# Patient Record
Sex: Female | Born: 1970 | Race: White | Hispanic: No | Marital: Married | Smoking: Current every day smoker
Health system: Southern US, Community
[De-identification: ages and names within clinical notes are randomized; demographics above are authoritative.]

## PROBLEM LIST (undated history)

## (undated) DIAGNOSIS — I10 Essential (primary) hypertension: Secondary | ICD-10-CM

## (undated) DIAGNOSIS — E162 Hypoglycemia, unspecified: Secondary | ICD-10-CM

## (undated) HISTORY — DX: Hypoglycemia, unspecified: E16.2

## (undated) HISTORY — DX: Essential (primary) hypertension: I10

---

## 2004-11-01 ENCOUNTER — Ambulatory Visit: Payer: Self-pay | Admitting: Family Medicine

## 2006-02-25 ENCOUNTER — Ambulatory Visit: Payer: Self-pay | Admitting: Gastroenterology

## 2006-03-07 ENCOUNTER — Ambulatory Visit: Payer: Self-pay | Admitting: Gastroenterology

## 2006-03-17 ENCOUNTER — Ambulatory Visit: Payer: Self-pay | Admitting: Gastroenterology

## 2006-09-23 ENCOUNTER — Ambulatory Visit: Payer: Self-pay | Admitting: Internal Medicine

## 2007-07-16 ENCOUNTER — Ambulatory Visit: Payer: Self-pay | Admitting: Internal Medicine

## 2007-10-10 ENCOUNTER — Ambulatory Visit: Payer: Self-pay | Admitting: Family Medicine

## 2008-01-25 ENCOUNTER — Ambulatory Visit: Payer: Self-pay | Admitting: Internal Medicine

## 2008-10-24 ENCOUNTER — Emergency Department: Payer: Self-pay | Admitting: Emergency Medicine

## 2010-10-29 ENCOUNTER — Ambulatory Visit: Payer: Self-pay

## 2011-01-29 ENCOUNTER — Observation Stay: Payer: Self-pay | Admitting: Internal Medicine

## 2011-01-29 ENCOUNTER — Ambulatory Visit: Payer: Self-pay

## 2011-01-29 LAB — COMPREHENSIVE METABOLIC PANEL
Albumin: 3.7 g/dL (ref 3.4–5.0)
Anion Gap: 9 (ref 7–16)
Bilirubin,Total: 0.3 mg/dL (ref 0.2–1.0)
Calcium, Total: 8.5 mg/dL (ref 8.5–10.1)
Co2: 26 mmol/L (ref 21–32)
Creatinine: 0.77 mg/dL (ref 0.60–1.30)
EGFR (African American): >60
EGFR (Non-African Amer.): >60
Glucose: 86 mg/dL (ref 65–99)
Osmolality: 285 (ref 275–301)
Potassium: 4.4 mmol/L (ref 3.5–5.1)
SGOT(AST): 21 U/L (ref 15–37)
Sodium: 143 mmol/L (ref 136–145)

## 2011-01-29 LAB — CBC
HGB: 13.9 g/dL (ref 12.0–16.0)
MCV: 90 fL (ref 80–100)
RBC: 4.56 10*6/uL (ref 3.80–5.20)
RDW: 13.9 % (ref 11.5–14.5)

## 2011-01-29 LAB — URINALYSIS, COMPLETE
Bilirubin,UR: NEGATIVE
Glucose,UR: NEGATIVE mg/dL (ref 0–75)
Leukocyte Esterase: NEGATIVE
Nitrite: NEGATIVE
Ph: 8 (ref 4.5–8.0)
WBC UR: <1 /HPF (ref 0–5)

## 2011-01-29 LAB — TROPONIN I
Troponin-I: <0.02 ng/mL
Troponin-I: <0.02 ng/mL

## 2011-01-29 LAB — CK TOTAL AND CKMB (NOT AT ARMC): CK, Total: 109 U/L (ref 21–215)

## 2011-01-30 LAB — BASIC METABOLIC PANEL
BUN: 15 mg/dL (ref 7–18)
Calcium, Total: 8.4 mg/dL — ABNORMAL LOW (ref 8.5–10.1)
Chloride: 107 mmol/L (ref 98–107)
Co2: 26 mmol/L (ref 21–32)
Creatinine: 0.96 mg/dL (ref 0.60–1.30)
EGFR (Non-African Amer.): >60
Glucose: 112 mg/dL — ABNORMAL HIGH (ref 65–99)
Osmolality: 285 (ref 275–301)
Potassium: 4.2 mmol/L (ref 3.5–5.1)
Sodium: 142 mmol/L (ref 136–145)

## 2011-01-30 LAB — CBC WITH DIFFERENTIAL/PLATELET
Basophil %: 0.6 %
Eosinophil #: 0.3 10*3/uL (ref 0.0–0.7)
HCT: 38.7 % (ref 35.0–47.0)
HGB: 12.9 g/dL (ref 12.0–16.0)
Lymphocyte %: 21.8 %
MCH: 30.2 pg (ref 26.0–34.0)
MCHC: 33.3 g/dL (ref 32.0–36.0)
MCV: 91 fL (ref 80–100)
Monocyte #: 0.6 10*3/uL (ref 0.0–0.7)
Neutrophil #: 6 10*3/uL (ref 1.4–6.5)
Neutrophil %: 68 %
Platelet: 190 10*3/uL (ref 150–440)
RBC: 4.26 10*6/uL (ref 3.80–5.20)
WBC: 8.8 10*3/uL (ref 3.6–11.0)

## 2011-01-30 LAB — LIPID PANEL
Cholesterol: 158 mg/dL (ref 0–200)
Triglycerides: 143 mg/dL (ref 0–200)

## 2011-01-30 LAB — TROPONIN I: Troponin-I: <0.02 ng/mL

## 2012-05-29 ENCOUNTER — Ambulatory Visit (INDEPENDENT_AMBULATORY_CARE_PROVIDER_SITE_OTHER): Payer: BC Managed Care – PPO | Admitting: General Surgery

## 2012-06-04 ENCOUNTER — Ambulatory Visit (INDEPENDENT_AMBULATORY_CARE_PROVIDER_SITE_OTHER): Payer: BC Managed Care – PPO | Admitting: General Surgery

## 2012-12-17 ENCOUNTER — Emergency Department: Payer: Self-pay | Admitting: Emergency Medicine

## 2014-05-01 NOTE — Consult Note (Signed)
EGD showed normal esophagus but erosive gastritis in antral area. Bx's taken for H.pylori. Ok for discharge from GI point of view on prilosec bid and no NSAIDS. THanks.  Electronic Signatures: Lutricia Feilh, Horace Wishon (MD)  (Signed on 23-Jan-13 12:37)  Authored  Last Updated: 23-Jan-13 12:37 by Lutricia Feilh, Winnifred Dufford (MD)

## 2014-05-01 NOTE — Discharge Summary (Signed)
PATIENT NAME:  Katherine [name], [name] MR#:  [id number] DATE OF BIRTH:  19-Jul-1970  DATE OF ADMISSION:  01/29/2011 DATE OF DISCHARGE:  01/30/2011  PRIMARY CARE PHYSICIAN: Gardiner Rhymeebecca Orendorff, MD  DISCHARGE DIAGNOSES:  1. Chest pain, atypical, gastrointestinal-related.  2. Tobacco abuse.  3. Obesity.   DISCHARGE MEDICATIONS:  1. Omeprazole 20 mg twice a day.  2. Do not take Aleve.   ACTIVITY: As tolerated.   DIET: Regular diet.  DISCHARGE FOLLOWUP: Followup with Dr. Gardiner Rhymeebecca Orendorff in one to two weeks.  HISTORY OF PRESENT ILLNESS: The patient was admitted 01/29/2011 with chest pain. This is a 44 year old female who complained of pain in the center of her chest radiating up into the left upper chest. She was shopping in a drive-through at the time. Pain was 5/10 in intensity. She thought it was gas and took a Zantac. The pain eased off prior to bed. She woke up again with pain. She took another Zantac and went to work. She felt like she was going to pass out and had a cold, clammy sweat, became short of breath, went to an urgent care and was given a nitroglycerin, pain got better, and she was sent over to the emergency room and hospitalist services were contacted for further evaluation.   The patient had an upper endoscopy by Dr. Bluford Kaufmannh that showed normal esophagus and acute gastritis, which was biopsied, and normal duodenum.   LABS/STUDIES: EKG: Normal sinus rhythm. No acute ST-T wave changes.   Pregnancy test negative.   Urinalysis had 3+ blood.   Troponin negative. White blood cell count 6.1, hemoglobin and hematocrit 13.9 and 41.0, and platelet count 187. Chemistry: Glucose 86, BUN 15, creatinine 0.77, sodium 143, potassium 4.4, chloride 108, CO2 26, and calcium 8.5. Liver function tests normal.   Chest x-ray negative.   The next two sets of cardiac enzymes were negative. LDL 106, HDL 23, and triglycerides [id number]. Hemoglobin upon discharge 12.8.  HOSPITAL COURSE PER PROBLEM LIST:  1. For  the patient's chest pain, the patient still had this chest pain then described it was worse with eating and swallowing. Her stress test was canceled since the heart enzymes were negative and Gastroenterology was consulted for endoscopy. The endoscopy was done which showed gastritis. The patient was put on omeprazole. She was asked not to take Aleve. 2. For her tobacco abuse, she was going cold [place]. Smoking cessation counseling was done at the time of admission. 3. For obesity, weight loss was recommended.   The patient was discharged home in stable condition.   TIME SPENT ON DISCHARGE: 30 minutes.  ____________________________ Herschell Dimesichard J. Renae GlossWieting, MD rjw:slb D: 01/31/2011 14:56:07 ET T: 02/01/2011 09:49:21 ET JOB#: [id number]  cc: Herschell Dimesichard J. Renae GlossWieting, MD, <Dictator> Heidi Dachebecca O. Darliss Cheneyrendorff, MD Salley ScarletICHARD J Memory Heinrichs MD ELECTRONICALLY SIGNED 02/15/2011 16:15

## 2014-05-01 NOTE — H&P (Signed)
PATIENT NAME:  Katherine [name], Katherine [name] MR#:  [id number] DATE OF BIRTH:  [birth year]  DATE OF ADMISSION:  01/29/2011  PRIMARY CARE PHYSICIAN: Gardiner Rhyme, MD  CHIEF COMPLAINT: Chest pain.   HISTORY OF PRESENT ILLNESS: This is a 44 year old female who last night had pain in the center of her chest radiating up into the left upper chest. She was shopping and in a drive-through at the time. It was 5/10 in intensity. She thought it was gas and took a Zantac. She still had the pain but it eased off prior to bed. She woke up again this morning with the pain. She took another Zantac. She went to work and then felt the pain becoming worse. She felt like she was going to pass out. She had a cold, clammy sweat and some shortness of breath like a restricting breath. She went to an urgent care. They gave her a nitroglycerin and the pain got better, and she was sent over to the Emergency Room for further evaluation. In the Emergency Room, her EKG was normal. Her first troponin was negative. Hospitalist services were contacted for further evaluation.   PAST MEDICAL HISTORY:  1. Pseudotumor cerebri. 2. Tobacco abuse.  3. Obesity.   PAST SURGICAL HISTORY:  1. Hernia.  2. Cesarean section.   ALLERGIES: No known drug allergies.   MEDICATIONS:  1. PRN Aleve  2. PRN Zantac.   SOCIAL HISTORY: She smokes half-pack per day. No alcohol. No drug use. She works as a Hotel manager.   FAMILY HISTORY: Mother is 57 and has a lot of musculoskeletal issues. Father is 28 with a CABG about a year or two ago. Her grandfather died at age 60 of a myocardial infarction.   REVIEW OF SYSTEMS: CONSTITUTIONAL: Positive or sweats. No fever and no chills. No weight loss and no weight gain. No weakness or fatigue. EYES: She does have blurred vision with the pseudotumor cerebri. She does have glasses. EARS, NOSE, MOUTH, AND THROAT: Positive for sore throat for one month. No trouble swallowing. CARDIOVASCULAR:  Positive for chest pain. No palpitations. RESPIRATORY: Positive for shortness of breath. No cough. No sputum. No hemoptysis. GASTROINTESTINAL: Positive for bowel problems with constipation. No nausea, no vomiting, no abdominal pain, no bright red blood per rectum, and no melena. GENITOURINARY: No burning on urination or hematuria. MUSCULOSKELETAL: She always has some sternum pain from a fracture in the sternum in the past. INTEGUMENT: No rashes or eruptions. NEUROLOGIC: No fainting or blackouts. PSYCHIATRIC: No anxiety or depression. ENDOCRINE: No thyroid problems. HEMATOLOGIC/LYMPHATIC: No anemia. No easy bruising or bleeding.   PHYSICAL EXAMINATION:   VITAL SIGNS: Temperature 97.8, pulse 61, respirations 18, blood pressure 138/67, pulse oximetry 98%.   GENERAL: No respiratory distress.   EYES: Conjunctivae and lids normal. Pupils equal, round, and reactive to light. Extraocular muscles intact. No nystagmus.   EARS, NOSE, MOUTH, AND THROAT: Tympanic membranes no erythema. Nasal mucosa no erythema. Throat no erythema. No exudate seen. Lips and gums normal.   NECK: No JVD. No bruits. No lymphadenopathy. No thyromegaly. No thyroid nodules palpated.   LUNGS: Lungs are clear to auscultation. No use of accessory muscles to breathe. No rhonchi, rales, or wheeze heard.   HEART: S1 and S2 normal. No gallops, rubs, or murmurs heard. Carotid upstroke 2+ bilaterally. No bruits.   EXTREMITIES: Dorsalis pedis pulses 2+ bilaterally. No edema of the lower extremities.  ABDOMEN: Soft and nontender. No organomegaly/splenomegaly. Normoactive bowel sounds. No masses felt.   LYMPHATIC: No lymph  nodes in the neck. No lymphadenopathy in the left axilla.   MUSCULOSKELETAL: No clubbing, edema, or cyanosis.   SKIN: No rashes or ulcers seen.   NEUROLOGIC: Cranial nerves II through XII grossly intact. Deep tendon reflexes 2+ bilateral lower extremities.   PSYCHIATRIC: The patient is oriented to person, place, and  time.   CHEST WALL: Positive slight pain to palpation over the sternum.  LABS/STUDIES: EKG: Normal sinus rhythm, no acute ST-T wave changes.   Cardiac enzymes negative. Glucose 86, BUN 15, creatinine 0.77, sodium 143, potassium 4.4, chloride 108, CO2 26, calcium 8.5. Liver function tests normal. White blood cell count 6.1, hemoglobin and hematocrit 13.9 and 41.0, platelet count 187. Troponin negative.   Urinalysis: 3+ blood.  Pregnancy test negative.   ASSESSMENT AND PLAN:  1. Chest pain: Low likelihood for cardiac disease. The patient is concerned for cardiac disease and wants to stay for a stress test. I did offer the patient a chance to go home and follow-up as an outpatient, but we will admit as an observation. Check cardiac enzymes. Off-unit telemetry. We will get a stress test in the a.m. This could also be musculoskeletal or gastroesophageal reflux. We will place on omeprazole twice a day.  2. Tobacco abuse: Smoking cessation counseling done, three minutes by me. The patient wants to go cold [place].      3. Obesity with a BMI of 39.5: The patient told me that she has to lose more weight.   TIME SPENT ON ADMISSION: 50 minutes.  ____________________________ Herschell Dimesichard J. Renae GlossWieting, MD rjw:slb D: 01/29/2011 14:13:02 ET T: 01/29/2011 14:23:55 ET JOB#: [id number]  cc: Herschell Dimesichard J. Renae GlossWieting, MD, <Dictator> Heidi Dachebecca O. Darliss Cheneyrendorff, MD Salley ScarletICHARD J Adream Parzych MD ELECTRONICALLY SIGNED 01/31/2011 15:58

## 2016-01-26 ENCOUNTER — Other Ambulatory Visit: Payer: Self-pay | Admitting: Obstetrics and Gynecology

## 2016-01-26 DIAGNOSIS — Z1231 Encounter for screening mammogram for malignant neoplasm of breast: Secondary | ICD-10-CM

## 2017-03-21 ENCOUNTER — Other Ambulatory Visit: Payer: Self-pay | Admitting: Orthopedic Surgery

## 2017-03-21 DIAGNOSIS — M25561 Pain in right knee: Secondary | ICD-10-CM

## 2017-03-28 ENCOUNTER — Other Ambulatory Visit: Payer: Self-pay | Admitting: Orthopedic Surgery

## 2017-03-28 DIAGNOSIS — M25561 Pain in right knee: Secondary | ICD-10-CM

## 2017-04-02 ENCOUNTER — Ambulatory Visit
Admission: RE | Admit: 2017-04-02 | Discharge: 2017-04-02 | Disposition: A | Discharge status: Home / Self Care | Payer: Managed Care, Other (non HMO) | Source: Ambulatory Visit | Attending: Orthopedic Surgery | Admitting: Orthopedic Surgery

## 2017-04-02 DIAGNOSIS — M25561 Pain in right knee: Secondary | ICD-10-CM | POA: Diagnosis not present

## 2018-07-02 ENCOUNTER — Ambulatory Visit (INDEPENDENT_AMBULATORY_CARE_PROVIDER_SITE_OTHER): Payer: 59 | Admitting: Licensed Clinical Social Worker

## 2018-07-02 DIAGNOSIS — F322 Major depressive disorder, single episode, severe without psychotic features: Secondary | ICD-10-CM | POA: Diagnosis not present
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
## 2018-07-16 ENCOUNTER — Other Ambulatory Visit: Payer: Self-pay

## 2018-07-16 ENCOUNTER — Ambulatory Visit (INDEPENDENT_AMBULATORY_CARE_PROVIDER_SITE_OTHER): Payer: 59 | Admitting: Licensed Clinical Social Worker

## 2018-07-16 ENCOUNTER — Encounter: Payer: Self-pay | Admitting: Licensed Clinical Social Worker

## 2018-07-16 DIAGNOSIS — F322 Major depressive disorder, single episode, severe without psychotic features: Secondary | ICD-10-CM | POA: Diagnosis not present
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
## 2018-08-13 ENCOUNTER — Other Ambulatory Visit: Payer: Self-pay

## 2018-08-13 ENCOUNTER — Encounter: Payer: Self-pay | Admitting: Licensed Clinical Social Worker

## 2018-08-13 ENCOUNTER — Ambulatory Visit (INDEPENDENT_AMBULATORY_CARE_PROVIDER_SITE_OTHER): Payer: 59 | Admitting: Licensed Clinical Social Worker

## 2018-08-13 DIAGNOSIS — F322 Major depressive disorder, single episode, severe without psychotic features: Secondary | ICD-10-CM | POA: Diagnosis not present
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
## 2018-08-20 ENCOUNTER — Encounter: Payer: Self-pay | Admitting: Licensed Clinical Social Worker

## 2018-08-20 ENCOUNTER — Other Ambulatory Visit: Payer: Self-pay

## 2018-08-20 ENCOUNTER — Ambulatory Visit (INDEPENDENT_AMBULATORY_CARE_PROVIDER_SITE_OTHER): Payer: 59 | Admitting: Licensed Clinical Social Worker

## 2018-08-20 DIAGNOSIS — F322 Major depressive disorder, single episode, severe without psychotic features: Secondary | ICD-10-CM
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
## 2018-08-27 ENCOUNTER — Encounter: Payer: Self-pay | Admitting: Licensed Clinical Social Worker

## 2018-08-27 ENCOUNTER — Ambulatory Visit (INDEPENDENT_AMBULATORY_CARE_PROVIDER_SITE_OTHER): Payer: 59 | Admitting: Licensed Clinical Social Worker

## 2018-08-27 ENCOUNTER — Other Ambulatory Visit: Payer: Self-pay

## 2018-08-27 DIAGNOSIS — F322 Major depressive disorder, single episode, severe without psychotic features: Secondary | ICD-10-CM | POA: Diagnosis not present

## 2018-08-27 NOTE — Progress Notes (Signed)
Virtual Visit via Video Note  I connected with Katherine [name] on 08/27/18 at  8:00 AM EDT by a video enabled telemedicine application and verified that I am speaking with the correct person using two identifiers.   I discussed the limitations of evaluation and management by telemedicine and the availability of in person appointments. The patient expressed understanding and agreed to proceed.   I discussed the assessment and treatment plan with the patient. The patient was provided an opportunity to ask questions and all were answered. The patient agreed with the plan and demonstrated an understanding of the instructions.   The patient was advised to call back or seek an in-person evaluation if the symptoms worsen or if the condition fails to improve as anticipated.  I provided 60 minutes of non-face-to-face time during this encounter.   Alden Hipp, LCSW    THERAPIST PROGRESS NOTE  Session Time: 0800  Participation Level: Active  Behavioral Response: CasualAlertDepressed  Type of Therapy: Individual Therapy  Treatment Goals addressed: Coping  Interventions: Solution Focused and Supportive  Summary: Katherine [name] is a 48 y.o. female who presents with continued symptoms of her diagnosis. Katherine [name] reports doing well since our last session, but reported increased depression around her separation from her husband. She reports talking to him on Monday night, "we talked for almost two hours." She reported feeling she needs to set better boundaries with him, as he is not willing to get back together, "I begged him again and he just said no." We discussed ways to set firm boundaries, and to set boundaries in her mind so that she remembers her marriage clearly instead of only the positive things. Katherine [name] reported she started writing a list of the bad things that happened in her marriage to reference when she is begging her ex to go home. We discussed ways Katherine [name] could utilize this list to  manage her boundaries with Katherine [name]. Further, we discussed how Katherine [name] will not truly let herself be with Katherine [name] because she still views him as a catalyst of her life "exploding." She agreed with this statement and discussed she was unsure how to change that in her mind. LCSW explained that, likely, when she sets boundaries with Katherine [name], she will allow herself to be fully present with Katherine [name]--but at this moment, she is not able to do that because she is attempting to be in both relationships. Katherine [name] reported her daughter has told her she doesn't feel she would be happy if she returned to Katherine [name]. LCSW asked Katherine [name] to spend time thinking what it would be like if she returned to her marriage. LCSW asked [name] if she felt things would change and if they would be sustainable changes. Katherine [name] reported, "If I really think about it, no. I don't think I would be happy."   Suicidal/Homicidal: No  Therapist Response: Katherine [name] continues to work towards her tx goals but has not yet reached them. We will continue to work on emotional regulation skills, improving communication, and improving utilization of CBT skills to manage negative thoughts.   Plan: Return again in 1 weeks.  Diagnosis: Axis I: MDD    Axis II: No diagnosis    Alden Hipp, LCSW 08/27/2018

## 2018-09-03 ENCOUNTER — Other Ambulatory Visit: Payer: Self-pay

## 2018-09-03 ENCOUNTER — Encounter: Payer: Self-pay | Admitting: Licensed Clinical Social Worker

## 2018-09-03 ENCOUNTER — Ambulatory Visit (INDEPENDENT_AMBULATORY_CARE_PROVIDER_SITE_OTHER): Payer: 59 | Admitting: Licensed Clinical Social Worker

## 2018-09-03 DIAGNOSIS — F322 Major depressive disorder, single episode, severe without psychotic features: Secondary | ICD-10-CM

## 2018-09-03 NOTE — Progress Notes (Signed)
Virtual Visit via Video Note  I connected with ATASHA COLEBANK on 09/03/18 at  8:00 AM EDT by a video enabled telemedicine application and verified that I am speaking with the correct person using two identifiers.   I discussed the limitations of evaluation and management by telemedicine and the availability of in person appointments. The patient expressed understanding and agreed to proceed.  I discussed the assessment and treatment plan with the patient. The patient was provided an opportunity to ask questions and all were answered. The patient agreed with the plan and demonstrated an understanding of the instructions.   The patient was advised to call back or seek an in-person evaluation if the symptoms worsen or if the condition fails to improve as anticipated.  I provided 60 minutes of non-face-to-face time during this encounter.   Alden Hipp, LCSW   THERAPIST PROGRESS NOTE  Session Time: 0800  Participation Level: Active  Behavioral Response: NeatAlertDepressed  Type of Therapy: Individual Therapy  Treatment Goals addressed: Coping  Interventions: CBT  Summary: SYBLE PICCO is a 48 y.o. female who presents with continued symptoms related to her diagnosis. Shaday reports having a "blah," week. She reports, "I'm just trying to accept the fact that my marriage is over and that's not going to change." LCSW validated Danial's feelings around this idea, and asked her to discuss what's contributed to that feeling over the last week. She reports her daughter made a comment about "well, that's not even an option anymore," when discussing Khori's husband. Stephania did not push for more information, as she wants to leave her daughter out of the middle; however, Alise reported it was "kind of a wake up." LCSW validated and normalized her feelings, and encouraged her to recognize things change frequently, and that may be true now but could always change--but regardless, she has to  be okay. We discussed transitioning our focus away from which man she should choose, and focusing on making sure Jeannemarie is okay regardless of who else is in her life. Flossie expressed understanding and agreement with this plan. Carrolyn was asked to write a list of who she is without being someone's wife or girlfriend. She reported feeling she didn't know who she was without a man in her life. LCSW asked Anna-Marie to find a quiet place away from anyone else to write this list. Additionally, Shamone reported feeling she does "everything for everyone but no one does anything for me." LCSW encouraged Elia to start setting more firm boundaries with those in her life by saying "no," to things when asked--if she doesn't want to do them. Wei reported, "that should be interesting." We discussed how setting boundaries will contribute to her having a better sense of self. Avalee expressed understanding and agreement.   Suicidal/Homicidal: No   Therapist Response: Demetrica continues to work towards her tx goals but has not yet reached them. We will continue to work on emotional regulation skills and improving communication.   Plan: Return again in 2 weeks.  Diagnosis: Axis I: MDD    Axis II: No diagnosis    Alden Hipp, LCSW 09/03/2018

## 2018-09-25 ENCOUNTER — Other Ambulatory Visit: Payer: Self-pay

## 2018-09-25 ENCOUNTER — Ambulatory Visit: Payer: 59 | Admitting: Licensed Clinical Social Worker

## 2018-10-02 ENCOUNTER — Encounter: Payer: Self-pay | Admitting: Licensed Clinical Social Worker

## 2018-10-02 ENCOUNTER — Other Ambulatory Visit: Payer: Self-pay

## 2018-10-02 ENCOUNTER — Ambulatory Visit (INDEPENDENT_AMBULATORY_CARE_PROVIDER_SITE_OTHER): Payer: 59 | Admitting: Licensed Clinical Social Worker

## 2018-10-02 DIAGNOSIS — F322 Major depressive disorder, single episode, severe without psychotic features: Secondary | ICD-10-CM | POA: Diagnosis not present

## 2018-10-02 NOTE — Progress Notes (Signed)
Virtual Visit via Video Note  I connected with Katherine [name] on 10/02/18 at  9:00 AM EDT by a video enabled telemedicine application and verified that I am speaking with the correct person using two identifiers.   I discussed the limitations of evaluation and management by telemedicine and the availability of in person appointments. The patient expressed understanding and agreed to proceed.  I discussed the assessment and treatment plan with the patient. The patient was provided an opportunity to ask questions and all were answered. The patient agreed with the plan and demonstrated an understanding of the instructions.   The patient was advised to call back or seek an in-person evaluation if the symptoms worsen or if the condition fails to improve as anticipated.  I provided 60 minutes of non-face-to-face time during this encounter.   Alden Hipp, LCSW    THERAPIST PROGRESS NOTE  Session Time: 0900  Participation Level: Active  Behavioral Response: CasualAlertAnxious  Type of Therapy: Individual Therapy  Treatment Goals addressed: Anxiety  Interventions: Supportive  Summary: Katherine [name] is a 48 y.o. female who presents with continued symptoms related to her diagnosis. Katherine [name] reports doing well since our last session. She reports moving into a new house next to her daughter's home, and is still living with her boyfriend. She reports her boyfriend is currently out of work, and she is feeling very overwhelmed and resentful when it comes to him. We discussed ways Katherine [name] could effectively communicate with her boyfriend about her feelings, and how she could do this in the future to avoid "blowing up." Katherine [name] expressed understanding and agreement. Katherine [name] went on to discuss feeling, with or without her ex-husband, she does not see a future with her boyfriend. She feels motivated to make a change, but feels responsible for him. We discussed what that responsibility looks like, and  ultimately came to the conclusion that the only thing we owe anyone is our honesty. Katherine [name] expressed understanding and agreement with this idea. We disucssed ways to stay safe/calm in the moment as well.  Suicidal/Homicidal: No.  Therapist Response: Katherine [name] continues to work towards her tx goals but has not yet reached them. We will continue to work on emotional regulation skills moving forward and improving communication.   Plan: Return again in 4 weeks.  Diagnosis: Axis I: MDD    Axis II: No diagnosis    Alden Hipp, LCSW 10/02/2018

## 2018-10-30 ENCOUNTER — Other Ambulatory Visit: Payer: Self-pay

## 2018-10-30 ENCOUNTER — Ambulatory Visit (INDEPENDENT_AMBULATORY_CARE_PROVIDER_SITE_OTHER): Payer: 59 | Admitting: Licensed Clinical Social Worker

## 2018-10-30 ENCOUNTER — Encounter: Payer: Self-pay | Admitting: Licensed Clinical Social Worker

## 2018-10-30 DIAGNOSIS — F322 Major depressive disorder, single episode, severe without psychotic features: Secondary | ICD-10-CM | POA: Diagnosis not present

## 2018-10-30 NOTE — Progress Notes (Signed)
  Virtual Visit via Video Note  I connected with Katherine [name] [name] on 10/30/18 at 10:00 AM EDT by a video enabled telemedicine application [name] verified that I am speaking with the correct person using two identifiers.   I discussed the limitations of evaluation [name] management by telemedicine [name] the availability of in person appointments. The patient expressed understanding [name] agreed to proceed.  I discussed the assessment [name] treatment plan with the patient. The patient was provided an opportunity to ask questions [name] all were answered. The patient agreed with the plan [name] demonstrated an understanding of the instructions.   The patient was advised to call back or seek an in-person evaluation if the symptoms worsen or if the condition fails to improve as anticipated.  I provided 60 minutes of non-face-to-face time during this encounter.   Alden Hipp, Katherine [name]   THERAPIST PROGRESS NOTE  Session Time: 1000 Participation Level: Active  Behavioral Response: NeatAlertAnxious  Type of Therapy: Individual Therapy  Treatment Goals addressed: Coping  Interventions: CBT  Summary: Katherine [name] [name] is a 48 y.o. female Katherine [name] presents with continued symptoms related to her diagnosis. Katherine [name] [name] reports doing well since our last session. Katherine [name] [name] reports she has had a lot of ups [name] downs since we last spoke. She reports her ex-husband has declared his love for his new girlfriend, which she thinks will mean he will ask for a divorce [name]. Katherine [name] [name] reports feeling extremely disappointed by this, but has been encouraging herself [name] her family members to be accepting of the new relationship. Katherine [name] [name], [name] recognized how it must be difficult to be so civil with a new girl in her ex's life. Katherine [name] [name] was in agreement, but noted she felt it was something she needed to do. Katherine [name] [name] went on to discuss how she is feeling about her [name]. She noted he has not contributed at  all to their new home, [name] has not offered ot pay for anything since moving into the new home. Katherine [name] [name] feels because they do not have to pay rent at the moment. Katherine [name] [name]. Katherine [name] [name] reported she worries she will say something she doesn't mean, but knows she needs to have very clear communication with him in order to see any kind of change. Katherine [name] [name] asked her to make time to have that conversation with her [name].   Suicidal/Homicidal: No  Therapist Response: Katherine [name] continues to work towards her tx goals but has not yet reached them. We will continue to work on emotional regulation skills [name] improving communication moving forward.   Plan: Return again in 2 weeks.  Diagnosis: Axis I: MDD    Axis II: No diagnosis    Alden Hipp, Katherine [name] 10/30/2018

## 2018-11-03 ENCOUNTER — Ambulatory Visit: Payer: Managed Care, Other (non HMO) | Admitting: Podiatry

## 2018-11-03 ENCOUNTER — Other Ambulatory Visit: Payer: Self-pay

## 2018-11-03 ENCOUNTER — Encounter: Payer: Self-pay | Admitting: Podiatry

## 2018-11-03 VITALS — BP 149/86 | HR 72

## 2018-11-03 DIAGNOSIS — L608 Other nail disorders: Secondary | ICD-10-CM

## 2018-11-03 DIAGNOSIS — M79675 Pain in left toe(s): Secondary | ICD-10-CM | POA: Diagnosis not present

## 2018-11-03 DIAGNOSIS — S90122A Contusion of left lesser toe(s) without damage to nail, initial encounter: Secondary | ICD-10-CM

## 2018-11-03 NOTE — Progress Notes (Signed)
Subjective:  Patient ID: Katherine [name], female    DOB: [birth year],  MRN: [id number]  Chief Complaint  Patient presents with  . Nail Problem    left foot, 4th toenail will not stay attached to nail bed after getting gel nails    48 y.o. female presents with the above complaint.  Patient states that she has had a history of toenail contusions of the fourth digit.  She had a acute trauma to the area.  She noticed that her toenail has never gone back properly after the trauma.  She also has a history of using gel toenails to all of her digits.  She is also concerned for possible fungal infection in the toenail.  She denies any other acute complaints.  The fourth toenail digit on the left side keeps falling off.  She states that it barely grows and then falls off afterwards.   Review of Systems: Negative except as noted in the HPI. Denies N/V/F/Ch.  No past medical history on file.  Current Outpatient Medications:  .  albuterol (VENTOLIN HFA) 108 (90 Base) MCG/ACT inhaler, Inhale into the lungs., Disp: , Rfl:  .  diazepam (VALIUM) 5 MG tablet, PLEASE SEE ATTACHED FOR DETAILED DIRECTIONS, Disp: , Rfl:   Social History   Tobacco Use  Smoking Status Never Smoker  Smokeless Tobacco Never Used    Allergies  Allergen Reactions  . Escitalopram Nausea Only  . Oxycodone-Acetaminophen Other (See Comments)    Other Reaction: ITCH  . Amoxicillin-Pot Clavulanate Rash  . Levofloxacin Rash   Objective:   Vitals:   11/03/18 1129  BP: (!) 149/86  Pulse: 72   There is no height or weight on file to calculate BMI. Constitutional Well developed. Well nourished.  Vascular Dorsalis pedis pulses palpable bilaterally. Posterior tibial pulses palpable bilaterally. Capillary refill normal to all digits.  No cyanosis or clubbing noted. Pedal hair growth normal.  Neurologic Normal speech. Oriented to person, place, and time. Epicritic sensation to light touch grossly present bilaterally.   Dermatologic  nail bed was inspected.  There is a step-off in the nail bed.  No open wounds or lesions noted.  No lacerations noted.  No toenail noted. No open wounds. No skin lesions.  Orthopedic:  No pain on palpation to the left fourth digit.   Radiographs: None Assessment:   1. Contusion of lesser toe of left foot without damage to nail, initial encounter   2. Toenail deformity   3. Pain of toe of left foot    Plan:  Patient was evaluated and treated and all questions answered.  Left fourth digit contusion -I explained to the patient the etiology of how the toenail grows.  I explained to her that the toenail follows the contour of the nailbed.  In her case the nailbed is damaged from a previous history of trauma.  The nailbed has a step-off to which then growing nail is not adhering to.  And therefore the toenail keeps falling off before it has chance to growth out to length.  I explained to the patient that unfortunately there is not much that could be done given that the trauma to the nail bed is already done. -I explained to her that once new toenails grows back I can have it permanently removed if she is having pain every time toenail comes off on itself.  She stated that that is a good idea and she will return in 3 months once her toenails have partially grown and.  No follow-ups on file.

## 2018-11-04 ENCOUNTER — Encounter: Payer: Self-pay | Admitting: Podiatry

## 2018-11-16 ENCOUNTER — Encounter: Payer: Self-pay | Admitting: Licensed Clinical Social Worker

## 2018-11-16 ENCOUNTER — Ambulatory Visit (INDEPENDENT_AMBULATORY_CARE_PROVIDER_SITE_OTHER): Payer: 59 | Admitting: Licensed Clinical Social Worker

## 2018-11-16 ENCOUNTER — Other Ambulatory Visit: Payer: Self-pay

## 2018-11-16 DIAGNOSIS — F322 Major depressive disorder, single episode, severe without psychotic features: Secondary | ICD-10-CM
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
## 2019-03-10 ENCOUNTER — Encounter: Payer: Self-pay | Admitting: Licensed Clinical Social Worker

## 2019-03-10 ENCOUNTER — Other Ambulatory Visit: Payer: Self-pay

## 2019-03-10 ENCOUNTER — Ambulatory Visit (INDEPENDENT_AMBULATORY_CARE_PROVIDER_SITE_OTHER): Payer: Managed Care, Other (non HMO) | Admitting: Licensed Clinical Social Worker

## 2019-03-10 DIAGNOSIS — F322 Major depressive disorder, single episode, severe without psychotic features: Secondary | ICD-10-CM | POA: Diagnosis not present

## 2019-03-10 NOTE — Progress Notes (Signed)
Virtual Visit via Video Note  I connected with OTHA RICKLES on 03/11/19 at 12:30 PM EST by a video enabled telemedicine application and verified that I am speaking with the correct person using two identifiers.   I discussed the limitations of evaluation and management by telemedicine and the availability of in person appointments. The patient expressed understanding and agreed to proceed.  I discussed the assessment and treatment plan with the patient. The patient was provided an opportunity to ask questions and all were answered. The patient agreed with the plan and demonstrated an understanding of the instructions.   The patient was advised to call back or seek an in-person evaluation if the symptoms worsen or if the condition fails to improve as anticipated.  I provided 60 minutes of non-face-to-face time during this encounter.   Heidi Dach, LCSW    THERAPIST PROGRESS NOTE  Session Time: 123-  Participation Level: Active  Behavioral Response: NeatAlertAnxious  Type of Therapy: Individual Therapy  Treatment Goals addressed: Coping  Interventions: Supportive  Summary: Katherine [name] is a 49 y.o. female who presents with continued symptoms related to her diagnosis. Kestrel reports doing well since our last session. She reports she was not able to attend therapy sessions due to starting a new job, but stated she has worked it out with her Production designer, theatre/television/film in order to have therapy sessions during her lunch period. Araya reports she has still not handling her divorce well, but notes the divorce will be final this Friday. She reports still wishing she could go home and "return to her old life." LCSW validated these feelings, but encouraged Nyari to recognize she may not get what sh wants from any interaction with her soon to be ex-husband. Analiyah expressed understanding and agreement with this informaiton, and noted she has come to the same conclusion. She also noted a few other  revalations since our last conversation: 1) she did not end her marriage alone, 2). 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 LCSW validated Oral's feelings on these topics and held space for her to discuss her feelings and thoughts, offering insight where appropriate.   Suicidal/Homicidal: No  Therapist Response: Charidy continues to work towards her tx goals but has not yet reached them. We will continue to work on improving communication and CBT skills moving forward.   Plan: Return again in 2 weeks.  Diagnosis: Axis I: MDD    Axis II: No diagnosis    Heidi Dach, LCSW 03/10/2019

## 2019-03-29 ENCOUNTER — Other Ambulatory Visit: Payer: Self-pay

## 2019-03-29 ENCOUNTER — Encounter: Payer: Self-pay | Admitting: Licensed Clinical Social Worker

## 2019-03-29 ENCOUNTER — Ambulatory Visit (INDEPENDENT_AMBULATORY_CARE_PROVIDER_SITE_OTHER): Payer: 59 | Admitting: Licensed Clinical Social Worker

## 2019-03-29 DIAGNOSIS — F322 Major depressive disorder, single episode, severe without psychotic features: Secondary | ICD-10-CM | POA: Diagnosis not present

## 2019-03-29 NOTE — Progress Notes (Signed)
Virtual Visit via Video Note  I connected with ARLINDA BARCELONA on 03/29/19 at 12:30 PM EDT by a video enabled telemedicine application and verified that I am speaking with the correct person using two identifiers.   I discussed the limitations of evaluation and management by telemedicine and the availability of in person appointments. The patient expressed understanding and agreed to proceed.  I discussed the assessment and treatment plan with the patient. The patient was provided an opportunity to ask questions and all were answered. The patient agreed with the plan and demonstrated an understanding of the instructions.   The patient was advised to call back or seek an in-person evaluation if the symptoms worsen or if the condition fails to improve as anticipated.  I provided 60 minutes of non-face-to-face time during this encounter.   Heidi Dach, LCSW    THERAPIST PROGRESS NOTE  Session Time: 1230  Participation Level: Active  Behavioral Response: CasualAlertAnxious  Type of Therapy: Individual Therapy  Treatment Goals addressed: Coping  Interventions: Solution Focused and Supportive  Summary: LETHIA DONLON is a 49 y.o. female who presents with continued symptoms related to her diagnosis. Kaliyan reports doing well since our last session. She reports there is one major stressor on her mind today. She reports her oldest daughter, Jon Gills, is currently not talking to her or allowing her to see her daughter due to her being with [name]. Gabriel Rung is actually Jon Gills' dad, but she feels he abandoned her during her childhood, and does not understand how her mother could want to be with him. Derry reports she feels she has tried from every angle to understand and talk openly with her daughter about the situation, but reports her daughter still does not want to engage with her. LCSW validated Aroura's feelings and held space for her to discuss her thoughts/feelings around the situation. We  discussed talking more openly with her daughter, and possibly giving her space to sort through her feelings, but alerting her that she was not disappearing and was here whenever she was ready to talk. Dorma was in agreement with this plan and expressed she planned to do so moving forward.   Suicidal/Homicidal: No  Therapist Response: Jashiya continues to work towards her tx goals but has not yet reached them. We will continue to work on improving emotional regulation skills and distress tolerance moving forward.   Plan: Return again in 2 weeks.  Diagnosis: Axis I: MDD    Axis II: No diagnosis    Heidi Dach, LCSW 03/29/2019

## 2019-04-12 ENCOUNTER — Ambulatory Visit: Payer: Managed Care, Other (non HMO) | Admitting: Licensed Clinical Social Worker

## 2020-05-17 ENCOUNTER — Other Ambulatory Visit: Payer: Self-pay

## 2020-05-17 ENCOUNTER — Emergency Department (HOSPITAL_COMMUNITY)
Admission: EM | Admit: 2020-05-17 | Discharge: 2020-05-17 | Disposition: A | Discharge status: Home / Self Care | Payer: BC Managed Care – PPO | Attending: Emergency Medicine | Admitting: Emergency Medicine

## 2020-05-17 ENCOUNTER — Encounter (HOSPITAL_COMMUNITY): Payer: Self-pay

## 2020-05-17 ENCOUNTER — Emergency Department (HOSPITAL_COMMUNITY): Payer: BC Managed Care – PPO

## 2020-05-17 DIAGNOSIS — R030 Elevated blood-pressure reading, without diagnosis of hypertension: Secondary | ICD-10-CM | POA: Diagnosis not present

## 2020-05-17 DIAGNOSIS — R61 Generalized hyperhidrosis: Secondary | ICD-10-CM | POA: Insufficient documentation

## 2020-05-17 DIAGNOSIS — R079 Chest pain, unspecified: Secondary | ICD-10-CM | POA: Diagnosis present

## 2020-05-17 DIAGNOSIS — M7989 Other specified soft tissue disorders: Secondary | ICD-10-CM | POA: Insufficient documentation

## 2020-05-17 DIAGNOSIS — F17210 Nicotine dependence, cigarettes, uncomplicated: Secondary | ICD-10-CM | POA: Diagnosis not present

## 2020-05-17 DIAGNOSIS — I161 Hypertensive emergency: Secondary | ICD-10-CM

## 2020-05-17 DIAGNOSIS — R110 Nausea: Secondary | ICD-10-CM | POA: Diagnosis not present

## 2020-05-17 DIAGNOSIS — Z9189 Other specified personal risk factors, not elsewhere classified: Secondary | ICD-10-CM

## 2020-05-17 DIAGNOSIS — Z87898 Personal history of other specified conditions: Secondary | ICD-10-CM

## 2020-05-17 DIAGNOSIS — Z9229 Personal history of other drug therapy: Secondary | ICD-10-CM | POA: Diagnosis not present

## 2020-05-17 DIAGNOSIS — R0602 Shortness of breath: Secondary | ICD-10-CM | POA: Diagnosis not present

## 2020-05-17 DIAGNOSIS — I700 Atherosclerosis of aorta: Secondary | ICD-10-CM

## 2020-05-17 LAB — CBC
HCT: 49.9 % — ABNORMAL HIGH (ref 36.0–46.0)
Hemoglobin: 16.4 g/dL — ABNORMAL HIGH (ref 12.0–15.0)
MCH: 34.5 pg — ABNORMAL HIGH (ref 26.0–34.0)
MCHC: 32.9 g/dL (ref 30.0–36.0)
MCV: 104.8 fL — ABNORMAL HIGH (ref 80.0–100.0)
Platelets: 232 10*3/uL (ref 150–400)
RBC: 4.76 MIL/uL (ref 3.87–5.11)
RDW: 12.6 % (ref 11.5–15.5)
WBC: 5 10*3/uL (ref 4.0–10.5)
nRBC: 0 % (ref 0.0–0.2)

## 2020-05-17 LAB — I-STAT BETA HCG BLOOD, ED (MC, WL, AP ONLY): I-stat hCG, quantitative: 5.8 m[IU]/mL — ABNORMAL HIGH (ref <5)

## 2020-05-17 LAB — BASIC METABOLIC PANEL
Anion gap: 8 (ref 5–15)
BUN: 14 mg/dL (ref 6–20)
CO2: 26 mmol/L (ref 22–32)
Calcium: 9 mg/dL (ref 8.9–10.3)
Chloride: 104 mmol/L (ref 98–111)
Creatinine, Ser: 0.93 mg/dL (ref 0.44–1.00)
GFR, Estimated: >60 mL/min (ref >60)
Glucose, Bld: 93 mg/dL (ref 70–99)
Potassium: 4.5 mmol/L (ref 3.5–5.1)
Sodium: 138 mmol/L (ref 135–145)

## 2020-05-17 LAB — TROPONIN I (HIGH SENSITIVITY)
Troponin I (High Sensitivity): 8 ng/L (ref <18)
Troponin I (High Sensitivity): 8 ng/L (ref <18)

## 2020-05-17 MED ORDER — CARVEDILOL 6.25 MG PO TABS: 6.2500 mg | ORAL_TABLET | Freq: Two times a day (BID) | ORAL | 0 refills | Status: AC

## 2020-05-17 MED ORDER — ASPIRIN EC 325 MG PO TBEC
325.0000 mg | DELAYED_RELEASE_TABLET | Freq: Once | ORAL | Status: AC
  Administered 2020-05-17: 325 mg via ORAL
  Filled 2020-05-17: qty 1

## 2020-05-17 MED ORDER — CARVEDILOL 3.125 MG PO TABS: 6.2500 mg | ORAL_TABLET | Freq: Two times a day (BID) | ORAL | Status: DC

## 2020-05-17 MED ORDER — ROSUVASTATIN CALCIUM 10 MG PO TABS: 10.0000 mg | ORAL_TABLET | Freq: Every day | ORAL | 0 refills | Status: AC

## 2020-05-17 MED ORDER — NITROGLYCERIN 0.4 MG SL SUBL
0.4000 mg | SUBLINGUAL_TABLET | SUBLINGUAL | Status: DC | PRN
  Administered 2020-05-17: 0.4 mg via SUBLINGUAL
  Filled 2020-05-17: qty 1

## 2020-05-17 MED ORDER — ROSUVASTATIN CALCIUM 5 MG PO TABS: 10.0000 mg | ORAL_TABLET | Freq: Every day | ORAL | Status: DC

## 2020-05-17 NOTE — Consult Note (Addendum)
Cardiology Consultation:   Patient ID: Katherine [name] MRN: [id number]; DOB: 1970-01-17  Admit date: 05/17/2020 Date of Consult: 05/17/2020  PCP:  Nonda Lou, MD   [facility] Providers Cardiologist:  Christell Constant, MD    Click here to update MD or APP on Care Team, Refresh:1}    Patient Profile:   Katherine [name] is a 50 y.o. female with a hx of Tobacco Abuse, and elevated blood pressure, HLD and Aortic Atherosclerosis, and Morbid Obesity s/p prior gastric surgery NOS (2019); anxiety of benzodiazepines who is being seen 05/17/2020 for the evaluation of HTN Urgency vs unstable angina at the request of Dr. Jeralyn Ruths.  History of Present Illness:   Ms. Katherine [name] has had a return of the symptoms that brought her into the hospital in 2013.  Patient notes that she is feeling terrible for the past two days.  Things have been doing ok for the past six months.  Over Mother's Day/Sunday, she was able to run around with her grandchildren (ages 57, 50, & 2) and pick up boxes (they are moving residences within [place]).  No chest pain/pressure/tightness.  For the past two days has felt tired, weak, and not like herself.  Husband notes that she has been under a lot of stress (more stress than in the past 6 months).  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  No exertional component.  Radiates to her shoulder.  Discomfort comes and goes but is not associated with activity.  Patient exertion notable for the above activity with her grandchildren without symptoms.  No shortness of breath, DOE .  No PND or orthopnea.  No bendopnea, weight gain, leg swelling , or abdominal swelling.  No syncope or has been dizzy over the past two days without vertiginous symptoms. Notes  no palpitations or funny heart beats, but  Has had them in the past ([facility] Holter done in 2019).    Patient had ED evaluation for similar symptoms at a drive through in  1017, which no evidence of MI and normal biomarkers.  Had this tightness in 07/2017 at [facility] with benign work up, and had similar discomfort later in 07/2017 with benign holter, echo, and stress test.  Given escalation in her symptoms went for ED evaluation.  No change in ECG from 2013 (borderline criteria for septal infarct) with first novel troponin WNL.  No history of pre-eclampsia, early menarche, or prematurity.  Does note prior Fen-fen use in the past.  Ambulatory BP not done.    History reviewed. No pertinent past medical history.  History reviewed. No pertinent surgical history.   Home Medications:  Prior to Admission medications   Medication Sig Start Date End Date Taking? Authorizing Provider  albuterol (VENTOLIN HFA) 108 (90 Base) MCG/ACT inhaler Inhale into the lungs. 01/22/18   [provider]  diazepam (VALIUM) 5 MG tablet PLEASE SEE ATTACHED FOR DETAILED DIRECTIONS 09/04/18   [provider]     Allergies:    Allergies  Allergen Reactions  . Escitalopram Nausea Only  . Oxycodone-Acetaminophen Other (See Comments)    Other Reaction: ITCH  . Amoxicillin-Pot Clavulanate Rash  . Levofloxacin Rash    Social History:   Social History   Socioeconomic History  . Marital status: Married    Spouse name: Not on file  . Number of children: Not on file  . Years of education: Not on file  . Highest education level: Not on  file  Occupational History  . Not on file  Tobacco Use  . Smoking status: Never Smoker  . Smokeless tobacco: Never Used  Substance and Sexual Activity  . Alcohol use: Not on file  . Drug use: Not on file  . Sexual activity: Not on file  Other Topics Concern  . Not on file  Social History Narrative  . Not on file   Social Determinants of Health   Financial Resource Strain: Not on file  Food Insecurity: Not on file  Transportation Needs: Not on file  Physical Activity: Not on file  Stress: Not on file  Social Connections: Not  on file  Intimate Partner Violence: Not on file    Family History:   Family history of CAD in father (age 63). Mother had high BP in the past  ROS:  Please see the history of present illness.  All other ROS reviewed and negative.     Physical Exam/Data:   Vitals:   05/17/20 1900 05/17/20 1915 05/17/20 1930 05/17/20 1945  BP: (!) 183/88 (!) 172/87 (!) 176/87 (!) 193/96  Pulse: 73 67 81 74  Resp: 18 15 19 17   Temp:      TempSrc:      SpO2: 99% 99% 98% 100%   No intake or output data in the 24 hours ending 05/17/20 1957 No flowsheet data found.   There is no height or weight on file to calculate BMI.  General:  Well nourished, well developed, in no acute distress HEENT: normal Lymph: no adenopathy Neck: no JVD Endocrine:  No thryomegaly Vascular: No carotid bruits; FA pulses 2+ bilaterally without bruits  Cardiac:  normal S1, S2; RRR; no murmur  Lungs:  clear to auscultation bilaterally, no wheezing, rhonchi or rales  Abd: soft, nontender, no hepatomegaly  Ext: no edema Musculoskeletal:  No deformities, BUE and BLE strength normal and equal Skin: warm and dry  Neuro:  CNs 2-12 intact, no focal abnormalities noted Psych:  Normal affect   EKG:  The EKG was personally reviewed and demonstrates:  SR borderline ant infarct pattern; rate 76 Tele: Rare PVCs Relevant CV Studies:  Cardiac Event Monitoring: Date:08/05/2017 Results: [facility] Report Only Sinus rhythm with occasional PVCs and PACs.  No significant runs.  No pauses.  No diary returned.    Transthoracic Echocardiogram: Date:08/07/2017 Results: [facility] Report Only INTERPRETATION  NORMAL LEFT VENTRICULAR SYSTOLIC FUNCTION  NORMAL RIGHT VENTRICULAR SYSTOLIC FUNCTION  NO VALVULAR STENOSIS  Closest EF: >55% (Estimated)  No valvular disease.   NonCardiac CT : Date: 05/20/2018 Results:[facility] Report Only Heart size is normal. There are mild coronary  artery distribution calcifications. No significant adenopathy is detected.    Date: 08/27/19 Results: [facility] Report Only Aorta is normal caliber with atherosclerotic calcifications present. There  is no acute or destructive appearing osseous lesion.   ECG Stress Testing : Date:07/31/2017 Results:[facility] Report Unavailable No further coronary testing done after  Laboratory Data:  High Sensitivity Troponin:   Recent Labs  Lab 05/17/20 1432 05/17/20 1825  TROPONINIHS 8 8     Chemistry Recent Labs  Lab 05/17/20 1432  NA 138  K 4.5  CL 104  CO2 26  GLUCOSE 93  BUN 14  CREATININE 0.93  CALCIUM 9.0  GFRNONAA >60  ANIONGAP 8    No results for input(s): PROT, ALBUMIN, AST, ALT, ALKPHOS, BILITOT in the last 168 hours. Hematology Recent Labs  Lab 05/17/20 1432  WBC 5.0  RBC 4.76  HGB 16.4*  HCT 49.9*  MCV  104.8*  MCH 34.5*  MCHC 32.9  RDW 12.6  PLT 232   BNPNo results for input(s): BNP, PROBNP in the last 168 hours.  DDimer No results for input(s): DDIMER in the last 168 hours.   Radiology/Studies:  DG Chest 2 View  Result Date: 05/17/2020 CLINICAL DATA:  Shortness of breath.  Mid chest pain. EXAM: CHEST - 2 VIEW COMPARISON:  Report from prior radiographs, images not available. FINDINGS: The cardiomediastinal contours are normal. The lungs are clear. Pulmonary vasculature is normal. No consolidation, pleural effusion, or pneumothorax. No acute osseous abnormalities are seen. IMPRESSION: No acute chest findings. Electronically Signed   By: Narda Rutherford M.D.   On: 05/17/2020 15:20     Assessment and Plan:   HTN urgency vs Unstable Angina HLD and Aortic Atherosclerosis Family history of CAD < Age 36 Morbid Obesity s/p bariatric surgery and prior Fen-phen use Tobacco Abuse Rare PVCs - pending second troponin - needs improved BP control (elevated blood pressure at office visits) Coreg 6.25 mg PO BID reasonable given the query of CAD & elevated BP (ordered) - LDL above goal; starting rosuvastatin 10 mg PO Daily and will need outpatient f/u  (ordered) - discussed smoking cessation (will try nicotine gum), exercise, and dietary changes; discussed started ambulatory BP monitoring - will need echo at some point for baseline in the setting of prior Fen-phen use - given fairly extensive prior work up, if similar troponin and BP is controled would be reasonable for outpatient cardiology follow up and D-SPECT NM lexiscan (available at our [facility]. Office) - if slight increase in troponin (~ 100 ng/L) reasonable OBS, trending troponin, and echocardiogram - if significant increase (~ 1000 ng/L) would start heparin, trend, and keep for LHC (NPO at midnight)  Plans to establish with [facility] and with me; will need outpatient follow up arranged if DC  Risk Assessment/Risk Scores:   HEART SCORE: 4 (Mod Suspicious, HLD and Positive Family history + Current smoking)     For questions or updates, please contact [facility] Please consult [email or web address] for contact info under    Signed, Christell Constant, MD  05/17/2020 7:57 PM

## 2020-05-17 NOTE — ED Notes (Signed)
Per PA can hold meds [re DC as they were supposed to be outpatient orders - pt is given prescriptions

## 2020-05-17 NOTE — ED Provider Notes (Signed)
Emergency Medicine Provider Triage Evaluation Note  Katherine [name] 50 y.o. female was evaluated in triage.  Pt complains of chest pain and shortness of breath.  She states it has been intermittently occurring for about 3 weeks or so.  She states that she will have intermittent waves of heaviness on her chest.  Sometimes she gets nauseous, diaphoretic with this.  She saw [facility] weeks ago and symptoms started.  They evaluated her told her her EKG was unremarkable.  She reports that today, swelling symptoms are getting worse.  She was at work and felt like she was going to pass out.  She states she has had also had some intermittent shortness of breath.  She states she feels some chest tightness right now.  No history of diabetes or hypertension.  No recent fevers, cough, chills.  She has not had any abdominal pain, /vomiting.   Review of Systems  Positive: Chest pain, shortness of breath, nausea Negative: Fevers, cough, abdominal pain, vomiting.  Physical Exam  BP 134/82   Pulse 70   Temp 98.2 F (36.8 C) (Oral)   Resp 18   Ht 5\' 4"  (1.626 m)   Wt 65.8 kg   SpO2 100%   BMI 24.89 kg/m  Gen:   Awake, no distress   HEENT:  Atraumatic  Resp:  Normal effort  Cardiac:  Normal rate  Abd:   Nondistended, nontender  MSK:   Moves extremities without difficulty  Neuro:  Speech clear   Other:   N/AA  Medical Decision Making  Medically screening exam initiated at 2:37 PM  Appropriate orders placed.  Katherine [name] was informed that the remainder of the evaluation will be completed by another provider, this initial triage assessment does not replace that evaluation, and the importance of remaining in the ED until their evaluation is complete.    Clinical Impression  Chest pain, shortness of breath.   Portions of this note were generated with Aldean Baker. Dictation errors may occur despite best attempts at proofreading.     Scientist, clinical (histocompatibility and immunogenetics), PA-C 05/17/20 1438     07/17/20, MD 05/17/20 [phone]

## 2020-05-17 NOTE — ED Provider Notes (Signed)
[facility] EMERGENCY DEPARTMENT Provider Note   CSN: [id number] Arrival date & time: 05/17/20  1415     History Chief Complaint  Patient presents with  . Chest Pain  . Shortness of Breath    Katherine [name] is a 50 y.o. female presenting for evaluation of   Patient states she has had intermittent chest pain for the past 3 weeks.  Over the past 2 days, she has had constant heaviness of the chest with radiation to the left shoulder.  She has associated diaphoresis and nausea with it today.  Today she felt like she was going to pass out.  Symptoms are not worse with exertion.  She tells me she has never had cardiac evaluation before.  She smokes cigarettes.  She reports her dad had his first heart attack at 44 years old.  She has no diagnosis of hypertension, however over the past several office visits, is noted to be hypertensive.  She denies recent travel, surgeries, immobilization, history of cancer, history of previous DVT/PE, or hormone use.  She has not taken anything for her symptoms.  She continues to have discomfort currently, although not as severe as earlier  HPI     History reviewed. No pertinent past medical history.  There are no problems to display for this patient.   History reviewed. No pertinent surgical history.   OB History   No obstetric history on file.     History reviewed. No pertinent family history.  Social History   Tobacco Use  . Smoking status: Never Smoker  . Smokeless tobacco: Never Used    Home Medications Prior to Admission medications   Medication Sig Start Date End Date Taking? Authorizing Provider  albuterol (VENTOLIN HFA) 108 (90 Base) MCG/ACT inhaler Inhale into the lungs. 01/22/18   [provider]  diazepam (VALIUM) 5 MG tablet PLEASE SEE ATTACHED FOR DETAILED DIRECTIONS 09/04/18   [provider]    Allergies    Escitalopram, Oxycodone-acetaminophen, Amoxicillin-pot clavulanate, and  Levofloxacin  Review of Systems   Review of Systems  Cardiovascular: Positive for chest pain.  Gastrointestinal: Positive for nausea.  All other systems reviewed and are negative.   Physical Exam Updated Vital Signs BP (!) 193/96   Pulse 74   Temp 97.9 F (36.6 C) (Oral)   Resp 17   SpO2 100%   Physical Exam Vitals and nursing note reviewed.  Constitutional:      General: She is not in acute distress.    Appearance: She is well-developed.     Comments: Resting in the bed in NAD  HENT:     Head: Normocephalic and atraumatic.  Eyes:     Extraocular Movements: Extraocular movements intact.     Conjunctiva/sclera: Conjunctivae normal.     Pupils: Pupils are equal, round, and reactive to light.  Cardiovascular:     Rate and Rhythm: Normal rate and regular rhythm.     Pulses: Normal pulses.     Comments: Clear lung sounds Pulmonary:     Effort: Pulmonary effort is normal. No respiratory distress.     Breath sounds: Normal breath sounds. No wheezing.     Comments: No ttp of the chest wall Chest:     Chest wall: No tenderness.  Abdominal:     General: There is no distension.     Palpations: Abdomen is soft. There is no mass.     Tenderness: There is no abdominal tenderness. There is no guarding or rebound.  Musculoskeletal:  General: Normal range of motion.     Cervical back: Normal range of motion and neck supple.     Right lower leg: No edema.     Left lower leg: No edema.     Comments: ttp or swelling of lower ext  Skin:    General: Skin is warm and dry.     Capillary Refill: Capillary refill takes less than 2 seconds.  Neurological:     Mental Status: She is alert and oriented to person, place, and time.     ED Results / Procedures / Treatments   Labs (all labs ordered are listed, but only abnormal results are displayed) Labs Reviewed  CBC - Abnormal; Notable for the following components:      Result Value   Hemoglobin 16.4 (*)    HCT 49.9 (*)     MCV 104.8 (*)    MCH 34.5 (*)    All other components within normal limits  I-STAT BETA HCG BLOOD, ED ([facility], [facility], [facility] ONLY) - Abnormal; Notable for the following components:   I-stat hCG, quantitative 5.8 (*)    All other components within normal limits  BASIC METABOLIC PANEL  TROPONIN I (HIGH SENSITIVITY)  TROPONIN I (HIGH SENSITIVITY)    EKG EKG Interpretation  Date/Time:  Wednesday May 17 2020 14:32:06 EDT Ventricular Rate:  76 PR Interval:  156 QRS Duration: 70 QT Interval:  362 QTC Calculation: 407 R Axis:   21 Text Interpretation: Normal sinus rhythm Septal infarct , age undetermined Abnormal ECG When compared to prior, similar appearance. No STEMI Confirmed by Theda Belfast ([id number]) on 05/17/2020 6:40:41 PM   Radiology DG Chest 2 View  Result Date: 05/17/2020 CLINICAL DATA:  Shortness of breath.  Mid chest pain. EXAM: CHEST - 2 VIEW COMPARISON:  Report from prior radiographs, images not available. FINDINGS: The cardiomediastinal contours are normal. The lungs are clear. Pulmonary vasculature is normal. No consolidation, pleural effusion, or pneumothorax. No acute osseous abnormalities are seen. IMPRESSION: No acute chest findings. Electronically Signed   By: Narda Rutherford M.D.   On: 05/17/2020 15:20    Procedures Procedures   Medications Ordered in ED Medications  nitroGLYCERIN (NITROSTAT) SL tablet 0.4 mg (0.4 mg Sublingual Given 05/17/20 1947)  rosuvastatin (CRESTOR) tablet 10 mg (has no administration in time range)  carvedilol (COREG) tablet 6.25 mg (has no administration in time range)  aspirin EC tablet 325 mg (325 mg Oral Given 05/17/20 1444)    ED Course  I have reviewed the triage vital signs and the nursing notes.  Pertinent labs & imaging results that were available during my care of the patient were reviewed by me and considered in my medical decision making (see chart for details).    MDM Rules/Calculators/A&P                          Patient  presenting for evaluation of chest pain.  On exam, she appears nontoxic.  However she continues to have heaviness today.  Is getting worse.  She does have some concerning history features including radiation to the shoulder, nausea, family history, and some risk factors including smoking and likely hypertension.  Labs obtained from triage interpreted by me, overall reassuring.  Initial troponin negative at 8.  Electrolytes are stable.  Hemoglobin normal.  Chest x-ray viewed and independently interpreted by me, no pneumonia, pneumothorax, effusion.  EKG is nonischemic.  She is pending delta troponin.  Discussed with Dr. Izora Ribas from cardiology,  who will evaluate the patient.  Repeat troponin negative.  Per cardiology consult, patient recommended to follow-up outpatient.  Recommends patient be started on Coreg and atorvastatin, meds ordered by cardiology.  Discussed findings and plan with patient, who is agreeable.  At this time, patient appears safe for discharge.  Return precautions given.  Patient states she understands and agrees to plan.  Final Clinical Impression(s) / ED Diagnoses Final diagnoses:  Chest pain, unspecified type  Elevated blood-pressure reading without diagnosis of hypertension    Rx / DC Orders ED Discharge Orders    None       Alveria Apley, PA-C 05/17/20 2038    Tegeler, Canary Brim, MD 05/17/20 2342

## 2020-05-17 NOTE — ED Notes (Signed)
Patient verbalizes understanding of discharge instructions. Opportunity for questioning and answers were provided. Armband removed by staff, pt discharged from ED ambulatory.   

## 2020-05-17 NOTE — Discharge Instructions (Signed)
Start taking blood pressure and cholesterol medicine as prescribed by the heart doctor. Call the office listed below to set up a follow-up appointment with heart doctor. Return to the emergency room if you develop severe worsening chest pain, difficulty breathing, any new, worsening, or concerning symptoms

## 2021-08-28 IMAGING — CR DG CHEST 2V
2 series · 2 of 2 positions shown · non-contrast
Comparison: Report from prior radiographs, images not available.

CLINICAL DATA: Shortness of breath.  Mid chest pain.

EXAM:
CHEST - 2 VIEW

[chest pa]
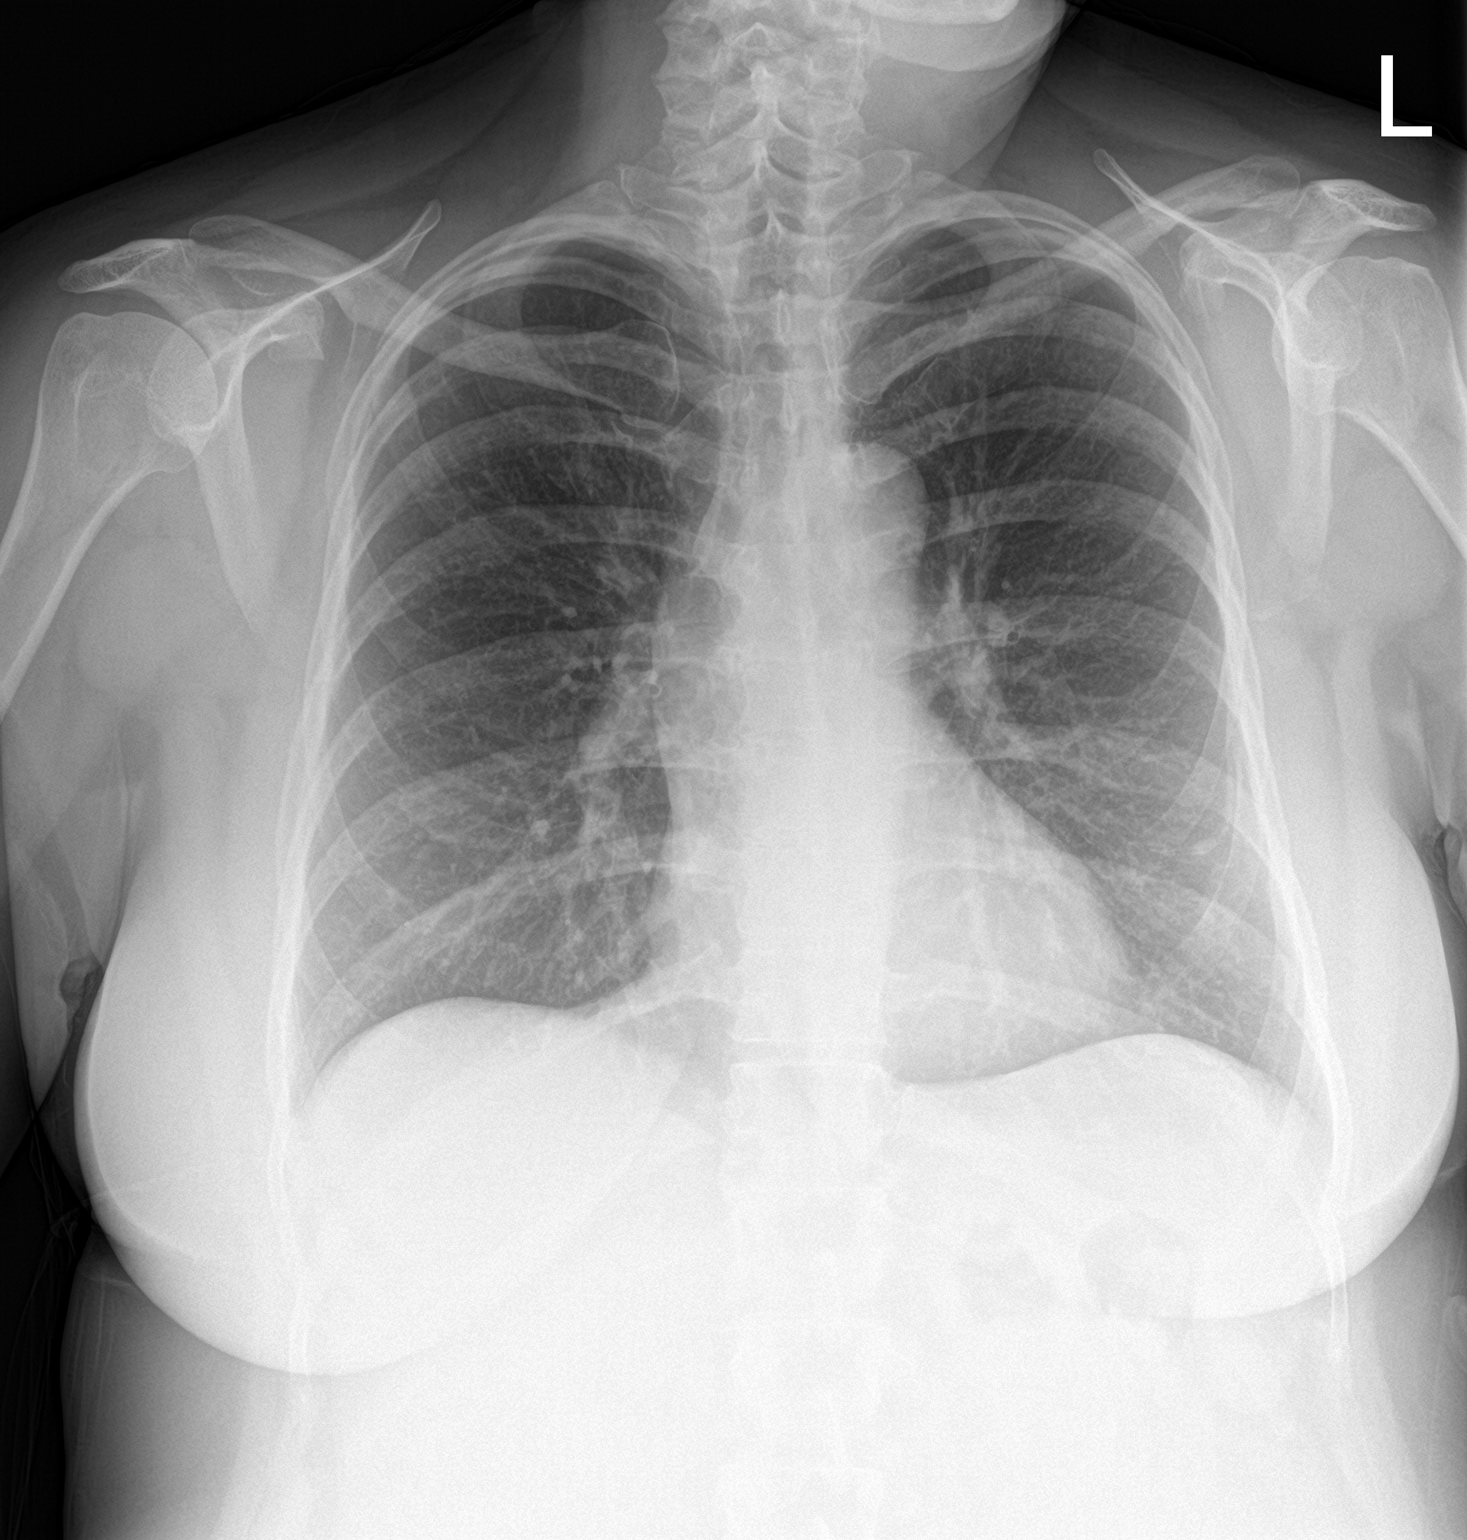

[chest lat]
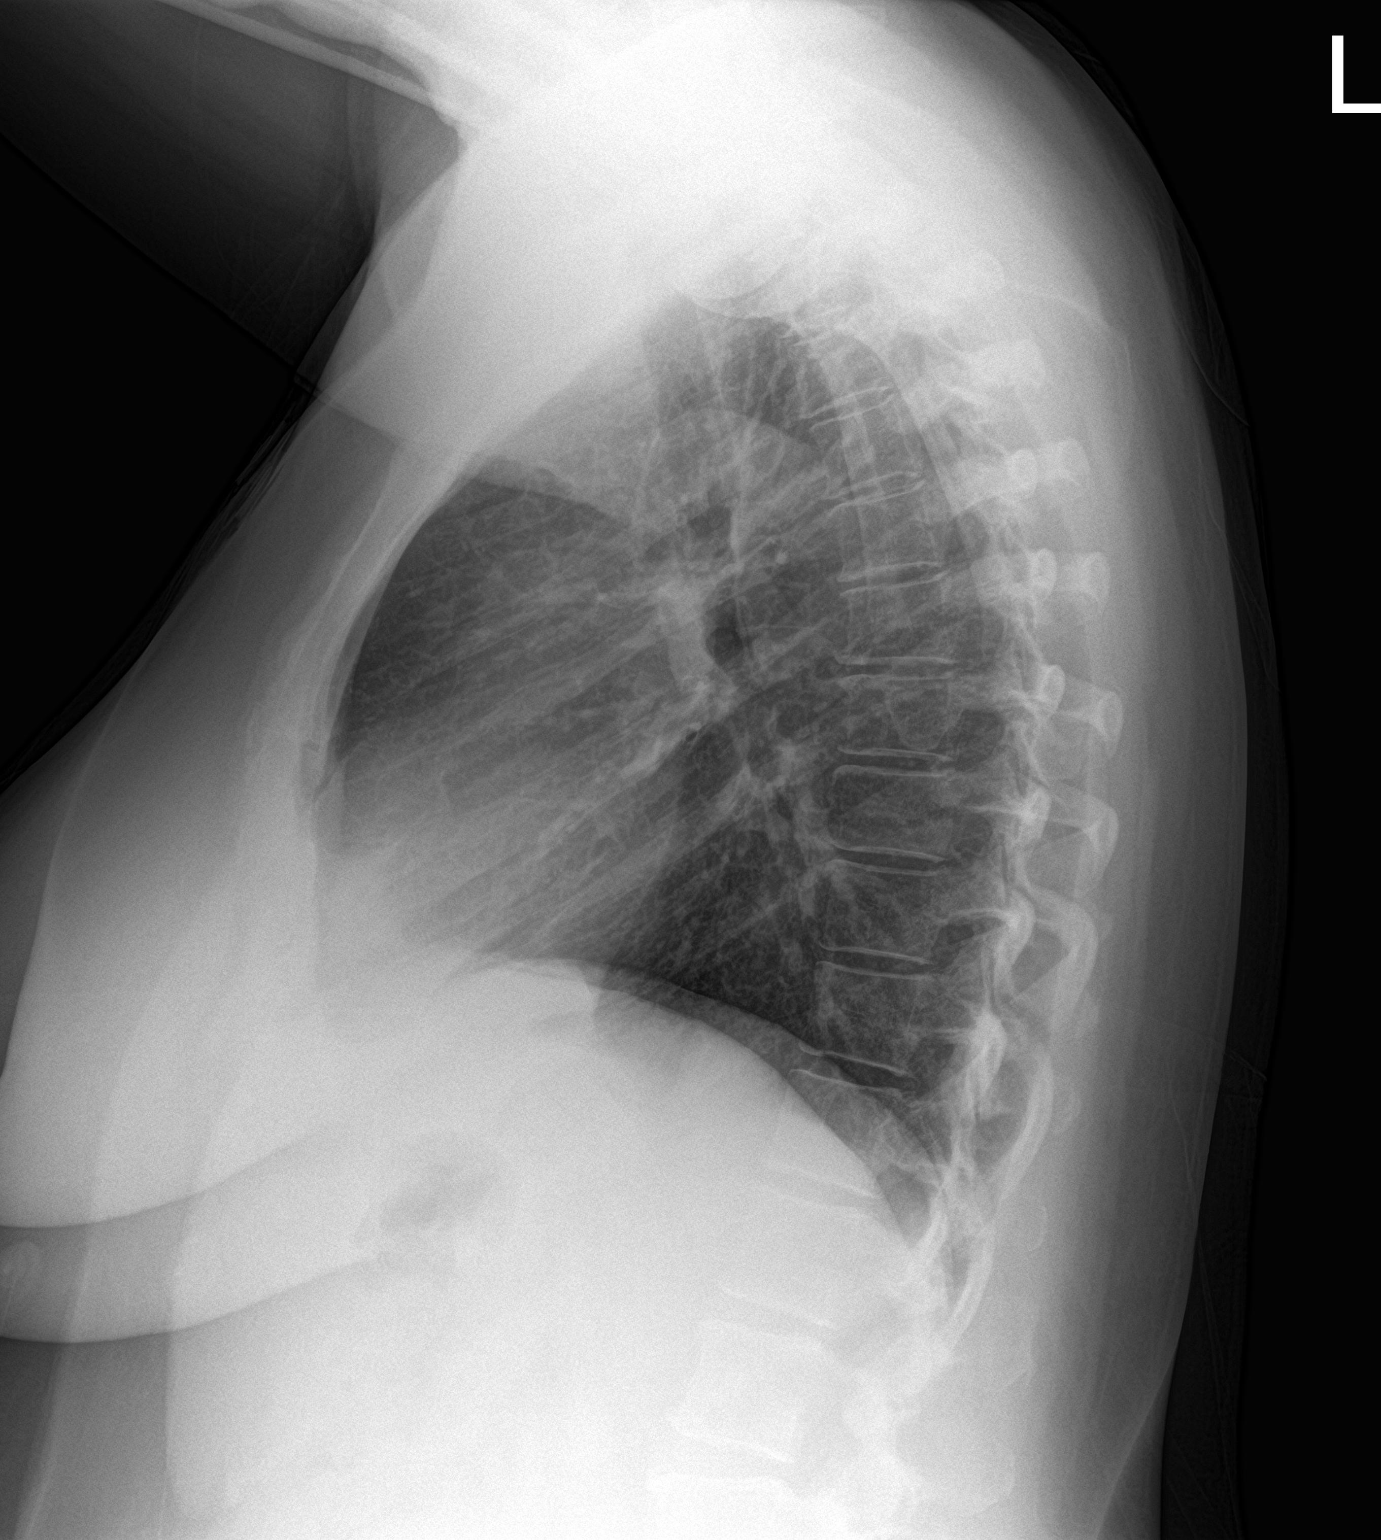

[2 of 2 positions shown; findings below may reference images not displayed]

FINDINGS: The cardiomediastinal contours are normal. The lungs are clear.
Pulmonary vasculature is normal. No consolidation, pleural effusion,
or pneumothorax. No acute osseous abnormalities are seen.
IMPRESSION: No acute chest findings.

## 2021-09-20 ENCOUNTER — Other Ambulatory Visit: Payer: Self-pay

## 2021-09-20 ENCOUNTER — Emergency Department
Admission: EM | Admit: 2021-09-20 | Discharge: 2021-09-20 | Disposition: A | Discharge status: Home / Self Care | Payer: 59 | Attending: Emergency Medicine | Admitting: Emergency Medicine

## 2021-09-20 ENCOUNTER — Encounter: Payer: Self-pay | Admitting: Intensive Care

## 2021-09-20 DIAGNOSIS — T50901A Poisoning by unspecified drugs, medicaments and biological substances, accidental (unintentional), initial encounter: Secondary | ICD-10-CM | POA: Insufficient documentation

## 2021-09-20 DIAGNOSIS — Z79899 Other long term (current) drug therapy: Secondary | ICD-10-CM | POA: Diagnosis not present

## 2021-09-20 DIAGNOSIS — I10 Essential (primary) hypertension: Secondary | ICD-10-CM | POA: Insufficient documentation

## 2021-09-20 LAB — CBC
HCT: 50.1 % — ABNORMAL HIGH (ref 36.0–46.0)
Hemoglobin: 16.7 g/dL — ABNORMAL HIGH (ref 12.0–15.0)
MCH: 33.1 pg (ref 26.0–34.0)
MCHC: 33.3 g/dL (ref 30.0–36.0)
MCV: 99.2 fL (ref 80.0–100.0)
Platelets: 186 10*3/uL (ref 150–400)
RBC: 5.05 MIL/uL (ref 3.87–5.11)
RDW: 12.3 % (ref 11.5–15.5)
WBC: 3.8 10*3/uL — ABNORMAL LOW (ref 4.0–10.5)
nRBC: 0 % (ref 0.0–0.2)

## 2021-09-20 LAB — BASIC METABOLIC PANEL
Anion gap: 11 (ref 5–15)
BUN: 9 mg/dL (ref 6–20)
CO2: 23 mmol/L (ref 22–32)
Calcium: 8.8 mg/dL — ABNORMAL LOW (ref 8.9–10.3)
Chloride: 106 mmol/L (ref 98–111)
Creatinine, Ser: 0.89 mg/dL (ref 0.44–1.00)
GFR, Estimated: >60 mL/min (ref >60)
Glucose, Bld: 120 mg/dL — ABNORMAL HIGH (ref 70–99)
Potassium: 4 mmol/L (ref 3.5–5.1)
Sodium: 140 mmol/L (ref 135–145)

## 2021-09-20 NOTE — ED Triage Notes (Addendum)
Patient reports accidentally taking husbands blood pressure medication around 11am. States taking 50mg  of losartan. Reports she started panicking and drove to ER. Also reports since this AM she has been feeling shaky, sweaty, near syncope, dizzy and blurry vision. Denies blurry vision at this time. A&O x4 in triage. Drove self to ER. C/o bilateral tingling feet at this time. Denies pain

## 2021-09-20 NOTE — ED Provider Notes (Signed)
[facility] Provider Note   Event Date/Time   First MD Initiated Contact with Patient 09/20/21 1238     (approximate) History  Medication Reaction  HPI Katherine [name] is a 51 y.o. female with a past medical history of hypertension and anxiety who presents after taking her husband's dose of losartan (50 mg) as opposed to her home dose of losartan (12.5 mg) and began having increasing anxiety leading to palpitations, shortness of breath, and paresthesias in her hands and feet.  Patient states that these paresthesias have resolved at this time as she states her anxiety is starting to decrease knowing that her blood pressure is high and not low.  Patient denies any other complaints at this time.  Patient states that she is just getting over a viral infection and has been having shortness of breath and palpitations over the last 3 days ROS: Patient currently denies any vision changes, tinnitus, difficulty speaking, facial droop, sore throat, chest pain, abdominal pain, nausea/vomiting/diarrhea, dysuria, or weakness/numbness/paresthesias in any extremity   Physical Exam  Triage Vital Signs: ED Triage Vitals  Enc Vitals Group     BP 09/20/21 1224 (!) 175/98     Pulse Rate 09/20/21 1224 89     Resp 09/20/21 1224 (!) 21     Temp 09/20/21 1224 99.1 F (37.3 C)     Temp Source 09/20/21 1224 Oral     SpO2 09/20/21 1224 100 %     Weight 09/20/21 1225 180 lb (81.6 kg)     Height 09/20/21 1225 5\' 3"  (1.6 m)     Head Circumference --      Peak Flow --      Pain Score 09/20/21 1225 0     Pain Loc --      Pain Edu? --      Excl. in GC? --    Most recent vital signs: Vitals:   09/20/21 1224  BP: (!) 175/98  Pulse: 89  Resp: (!) 21  Temp: 99.1 F (37.3 C)  SpO2: 100%   General: Awake, oriented x4. CV:  Good peripheral perfusion.  Resp:  Normal effort.  Clear to auscultation bilaterally Abd:  No distention.  Other:  Middle-aged obese Caucasian female laying in bed  in no acute distress ED Results / Procedures / Treatments  Labs (all labs ordered are listed, but only abnormal results are displayed) Labs Reviewed  BASIC METABOLIC PANEL - Abnormal; Notable for the following components:      Result Value   Glucose, Bld 120 (*)    Calcium 8.8 (*)    All other components within normal limits  CBC - Abnormal; Notable for the following components:   WBC 3.8 (*)    Hemoglobin 16.7 (*)    HCT 50.1 (*)    All other components within normal limits  URINALYSIS, ROUTINE W REFLEX MICROSCOPIC   EKG ED ECG REPORT I, 09/22/21, the attending physician, personally viewed and interpreted this ECG. Date: 09/20/2021 EKG Time: 1307 Rate: 90 Rhythm: normal sinus rhythm QRS Axis: normal Intervals: normal ST/T Wave abnormalities: normal Narrative Interpretation: no evidence of acute ischemia PROCEDURES: Critical Care performed: No .1-3 Lead EKG Interpretation  Performed by: [id number], MD Authorized by: Merwyn Katos, MD     Interpretation: normal     ECG rate:  88   ECG rate assessment: normal     Rhythm: sinus rhythm     Ectopy: none     Conduction: normal  MEDICATIONS ORDERED IN ED: Medications - No data to display IMPRESSION / MDM / ASSESSMENT AND PLAN / ED COURSE  I reviewed the triage vital signs and the nursing notes.                             The patient is on the cardiac monitor to evaluate for evidence of arrhythmia and/or significant heart rate changes. Patient's presentation is most consistent with acute presentation with potential threat to life or bodily function. This patient presents with symptoms consistent with acute anxiety reaction / panic attack. Low suspicion for acute cardiopulmonary process including ACS, PE, or thoracic aortic dissection. Denies any ingestions or any other medical complaints. No evidence of alcohol withdrawal symptoms. Presentation not consistent with overt toxidrome, ingestion given history &  physical. Presentation not consistent with organic or medical emergency at this time. No acute indication for psychiatric consultation (without SI/HI, AH/VH). Cautious return precautions discussed with full understanding.  Patient's increase in medication did not yield any significant abnormalities in patient's vital signs and patient is actually hypertensive at this time.  I discussed with patient the need to follow-up with her primary care physician if his hypertension persists  Plan: Psych follow up PRN Dispo: Discharge   FINAL CLINICAL IMPRESSION(S) / ED DIAGNOSES   Final diagnoses:  Medication overdose, accidental or unintentional, initial encounter   Rx / DC Orders   ED Discharge Orders     None      Note:  This document was prepared using Dragon voice recognition software and may include unintentional dictation errors.   Merwyn Katos, MD 09/20/21 [phone]

## 2021-09-20 NOTE — ED Notes (Signed)
Pt states coming in because she felt like she was going to lose consciousness. Pt reports having covid-19 last week and is now off of quarantine. Pt resting in bed. Pt states blurred vision and accidentally took the wrong dose of losartan. Pt states she also smells something like flowers/sweet in the nose.
